# Patient Record
Sex: Female | Born: 1955 | Race: Black or African American | Hispanic: No | Smoking: Never smoker
Health system: Southern US, Community
[De-identification: ages and names within clinical notes are randomized; demographics above are authoritative.]

---

## 2013-11-25 ENCOUNTER — Encounter (HOSPITAL_COMMUNITY): Payer: Self-pay | Admitting: Emergency Medicine

## 2013-11-25 ENCOUNTER — Emergency Department (INDEPENDENT_AMBULATORY_CARE_PROVIDER_SITE_OTHER)
Admission: EM | Admit: 2013-11-25 | Discharge: 2013-11-25 | Disposition: A | Discharge status: Home / Self Care | Payer: Self-pay | Source: Home / Self Care | Attending: Family Medicine | Admitting: Family Medicine

## 2013-11-25 ENCOUNTER — Ambulatory Visit (HOSPITAL_COMMUNITY): Payer: Self-pay | Attending: Emergency Medicine

## 2013-11-25 ENCOUNTER — Ambulatory Visit (HOSPITAL_COMMUNITY): Payer: Self-pay

## 2013-11-25 DIAGNOSIS — K5900 Constipation, unspecified: Secondary | ICD-10-CM

## 2013-11-25 DIAGNOSIS — K2970 Gastritis, unspecified, without bleeding: Secondary | ICD-10-CM

## 2013-11-25 DIAGNOSIS — R1011 Right upper quadrant pain: Secondary | ICD-10-CM | POA: Insufficient documentation

## 2013-11-25 DIAGNOSIS — R1010 Upper abdominal pain, unspecified: Secondary | ICD-10-CM

## 2013-11-25 DIAGNOSIS — B9681 Helicobacter pylori [H. pylori] as the cause of diseases classified elsewhere: Secondary | ICD-10-CM

## 2013-11-25 DIAGNOSIS — R1013 Epigastric pain: Secondary | ICD-10-CM

## 2013-11-25 LAB — CBC WITH DIFFERENTIAL/PLATELET
Basophils Absolute: 0 10*3/uL (ref 0.0–0.1)
Basophils Relative: 0 % (ref 0–1)
EOS ABS: 0.1 10*3/uL (ref 0.0–0.7)
EOS PCT: 2 % (ref 0–5)
HCT: 42.6 % (ref 36.0–46.0)
HEMOGLOBIN: 14.7 g/dL (ref 12.0–15.0)
LYMPHS ABS: 2 10*3/uL (ref 0.7–4.0)
Lymphocytes Relative: 39 % (ref 12–46)
MCH: 27.8 pg (ref 26.0–34.0)
MCHC: 34.5 g/dL (ref 30.0–36.0)
MCV: 80.5 fL (ref 78.0–100.0)
Monocytes Absolute: 0.4 10*3/uL (ref 0.1–1.0)
Monocytes Relative: 7 % (ref 3–12)
Neutro Abs: 2.5 10*3/uL (ref 1.7–7.7)
Neutrophils Relative %: 52 % (ref 43–77)
Platelets: 250 10*3/uL (ref 150–400)
RBC: 5.29 MIL/uL — AB (ref 3.87–5.11)
RDW: 13.5 % (ref 11.5–15.5)
WBC: 5 10*3/uL (ref 4.0–10.5)

## 2013-11-25 LAB — COMPREHENSIVE METABOLIC PANEL
ALK PHOS: 69 U/L (ref 39–117)
ALT: 24 U/L (ref 0–35)
ANION GAP: 11 (ref 5–15)
AST: 18 U/L (ref 0–37)
Albumin: 4.1 g/dL (ref 3.5–5.2)
BUN: 12 mg/dL (ref 6–23)
CO2: 29 meq/L (ref 19–32)
Calcium: 10.2 mg/dL (ref 8.4–10.5)
Chloride: 97 mEq/L (ref 96–112)
Creatinine, Ser: 0.85 mg/dL (ref 0.50–1.10)
GFR calc Af Amer: 87 mL/min — ABNORMAL LOW (ref >90)
GFR, EST NON AFRICAN AMERICAN: 75 mL/min — AB (ref >90)
Glucose, Bld: 97 mg/dL (ref 70–99)
Potassium: 4 mEq/L (ref 3.7–5.3)
Sodium: 137 mEq/L (ref 137–147)
TOTAL PROTEIN: 8.4 g/dL — AB (ref 6.0–8.3)
Total Bilirubin: 0.6 mg/dL (ref 0.3–1.2)

## 2013-11-25 LAB — POCT URINALYSIS DIP (DEVICE)
GLUCOSE, UA: NEGATIVE mg/dL
HGB URINE DIPSTICK: NEGATIVE
KETONES UR: NEGATIVE mg/dL
Leukocytes, UA: NEGATIVE
Nitrite: NEGATIVE
Protein, ur: NEGATIVE mg/dL
SPECIFIC GRAVITY, URINE: 1.025 (ref 1.005–1.030)
Urobilinogen, UA: 0.2 mg/dL (ref 0.0–1.0)
pH: 5.5 (ref 5.0–8.0)

## 2013-11-25 LAB — POCT PREGNANCY, URINE: Preg Test, Ur: NEGATIVE

## 2013-11-25 LAB — POCT H PYLORI SCREEN: H. PYLORI SCREEN, POC: POSITIVE — AB

## 2013-11-25 LAB — LIPASE, BLOOD: Lipase: 18 U/L (ref 11–59)

## 2013-11-25 MED ORDER — OMEPRAZOLE 20 MG PO CPDR: DELAYED_RELEASE_CAPSULE | ORAL | Status: DC

## 2013-11-25 MED ORDER — METRONIDAZOLE 500 MG PO TABS
500.0000 mg | ORAL_TABLET | Freq: Two times a day (BID) | ORAL | Status: DC
Start: 2013-11-25 — End: 2015-03-31

## 2013-11-25 MED ORDER — AMOXICILLIN 500 MG PO CAPS: 1000.0000 mg | ORAL_CAPSULE | Freq: Two times a day (BID) | ORAL | Status: DC

## 2013-11-25 MED ORDER — CLARITHROMYCIN 500 MG PO TABS: 500.0000 mg | ORAL_TABLET | Freq: Two times a day (BID) | ORAL | Status: DC

## 2013-11-25 NOTE — ED Notes (Signed)
Patient transported to X-ray 

## 2013-11-25 NOTE — ED Notes (Signed)
Called [place]; there was no answer

## 2013-11-25 NOTE — ED Notes (Addendum)
Via Daughter, [place] interpreter Pt c/o lower back pain onset 5 days; pain radiates to LUQ; getting worse Sx also include: nauseas, constipation Denies fevers, diarrhea Alert, no signs of acute distress.

## 2013-11-25 NOTE — Discharge Instructions (Signed)
Abdominal Pain °Many things can cause abdominal pain. Usually, abdominal pain is not caused by a disease and will improve without treatment. It can often be observed and treated at home. Your health care provider will do a physical exam and possibly order blood tests and X-rays to help determine the seriousness of your pain. However, in many cases, more time must pass before a clear cause of the pain can be found. Before that point, your health care provider may not know if you need more testing or further treatment. °HOME CARE INSTRUCTIONS  °Monitor your abdominal pain for any changes. The following actions may help to alleviate any discomfort you are experiencing: °· Only take over-the-counter or prescription medicines as directed by your health care provider. °· Do not take laxatives unless directed to do so by your health care provider. °· Try a clear liquid diet (broth, tea, or water) as directed by your health care provider. Slowly move to a bland diet as tolerated. °SEEK MEDICAL CARE IF: °· You have unexplained abdominal pain. °· You have abdominal pain associated with nausea or diarrhea. °· You have pain when you urinate or have a bowel movement. °· You experience abdominal pain that wakes you in the night. °· You have abdominal pain that is worsened or improved by eating food. °· You have abdominal pain that is worsened with eating fatty foods. °· You have a fever. °SEEK IMMEDIATE MEDICAL CARE IF:  °· Your pain does not go away within 2 hours. °· You keep throwing up (vomiting). °· Your pain is felt only in portions of the abdomen, such as the right side or the left lower portion of the abdomen. °· You pass bloody or black tarry stools. °MAKE SURE YOU: °· Understand these instructions.   °· Will watch your condition.   °· Will get help right away if you are not doing well or get worse.   °Document Released: 10/29/2004 Document Revised: 01/24/2013 Document Reviewed: 09/28/2012 °ExitCare® Patient Information  ©2015 [employer]. This information is not intended to replace advice given to you by your health care provider. Make sure you discuss any questions you have with your health care provider. ° °Gastritis, Adult °Gastritis is soreness and swelling (inflammation) of the lining of the stomach. Gastritis can develop as a sudden onset (acute) or long-term (chronic) condition. If gastritis is not treated, it can lead to stomach bleeding and ulcers. °CAUSES  °Gastritis occurs when the stomach lining is weak or damaged. Digestive juices from the stomach then inflame the weakened stomach lining. The stomach lining may be weak or damaged due to viral or bacterial infections. One common bacterial infection is the Helicobacter pylori infection. Gastritis can also result from excessive alcohol consumption, taking certain medicines, or having too much acid in the stomach.  °SYMPTOMS  °In some cases, there are no symptoms. When symptoms are present, they may include: °· Pain or a burning sensation in the upper abdomen. °· Nausea. °· Vomiting. °· An uncomfortable feeling of fullness after eating. °DIAGNOSIS  °Your caregiver may suspect you have gastritis based on your symptoms and a physical exam. To determine the cause of your gastritis, your caregiver may perform the following: °· Blood or stool tests to check for the H pylori bacterium. °· Gastroscopy. A thin, flexible tube (endoscope) is passed down the esophagus and into the stomach. The endoscope has a light and camera on the end. Your caregiver uses the endoscope to view the inside of the stomach. °· Taking a tissue sample (biopsy)   from the stomach to examine under a microscope. °TREATMENT  °Depending on the cause of your gastritis, medicines may be prescribed. If you have a bacterial infection, such as an H pylori infection, antibiotics may be given. If your gastritis is caused by too much acid in the stomach, H2 blockers or antacids may be given. Your caregiver may  recommend that you stop taking aspirin, ibuprofen, or other nonsteroidal anti-inflammatory drugs (NSAIDs). °HOME CARE INSTRUCTIONS °· Only take over-the-counter or prescription medicines as directed by your caregiver. °· If you were given antibiotic medicines, take them as directed. Finish them even if you start to feel better. °· Drink enough fluids to keep your urine clear or pale yellow. °· Avoid foods and drinks that make your symptoms worse, such as: °¨ Caffeine or alcoholic drinks. °¨ Chocolate. °¨ Peppermint or mint flavorings. °¨ Garlic and onions. °¨ Spicy foods. °¨ Citrus fruits, such as oranges, lemons, or limes. °¨ Tomato-based foods such as sauce, chili, salsa, and pizza. °¨ Fried and fatty foods. °· Eat small, frequent meals instead of large meals. °SEEK IMMEDIATE MEDICAL CARE IF:  °· You have black or dark red stools. °· You vomit blood or material that looks like coffee grounds. °· You are unable to keep fluids down. °· Your abdominal pain gets worse. °· You have a fever. °· You do not feel better after 1 week. °· You have any other questions or concerns. °MAKE SURE YOU: °· Understand these instructions. °· Will watch your condition. °· Will get help right away if you are not doing well or get worse. °Document Released: 01/13/2001 Document Revised: 07/21/2011 Document Reviewed: 03/04/2011 °ExitCare® Patient Information ©2015 [employer]. This information is not intended to replace advice given to you by your health care provider. Make sure you discuss any questions you have with your health care provider. ° °

## 2013-11-25 NOTE — ED Notes (Signed)
I spoke with Kara Rowe in [place] she said patient could come to the department and wait.  She would get to her as soon as she could but had 2 patients ahead of her

## 2013-11-25 NOTE — ED Provider Notes (Signed)
CSN: [id number]     Arrival date & time 11/25/13  [id number] History   First MD Initiated Contact with Patient 11/25/13 [phone]     Chief Complaint  Patient presents with  . Back Pain   (Consider location/radiation/quality/duration/timing/severity/associated sxs/prior Treatment) HPI       58 year old female presents for evaluation of 5 days of back and abdominal pain, constipation nausea, and one episode of vomiting yesterday. The symptoms are intermittent and gradually progressive. The pain is located in her left lower quadrant of her abdomen and in the right lower back. She denies any injury. She last had a normal bowel movement on Wednesday, 3 days ago, this is abnormal for her. She has nausea but only vomited once last night, she has not attempted to eat anything today. She admits to a possible recent history of weight loss although she did not have much because she has not weighed herself in 4 years, she just feels like maybe she has lost weight. Denies any blood in her stool.  History reviewed. No pertinent past medical history. History reviewed. No pertinent past surgical history. No family history on file. History  Substance Use Topics  . Smoking status: Never Smoker   . Smokeless tobacco: Not on file  . Alcohol Use: No   OB History   Grav Para Term Preterm Abortions TAB SAB Ect Mult Living                 Review of Systems  Gastrointestinal: Positive for nausea, vomiting, abdominal pain and constipation. Negative for diarrhea.  Musculoskeletal: Positive for back pain.  All other systems reviewed and are negative.   Allergies  Review of patient's allergies indicates no known allergies.  Home Medications   Prior to Admission medications   Medication Sig Start Date End Date Taking? Authorizing Provider  amoxicillin (AMOXIL) 500 MG capsule Take 2 capsules (1,000 mg total) by mouth 2 (two) times daily. 11/25/13   Adrian BlackwaterZachary H Katriana Dortch, PA-C  clarithromycin (BIAXIN) 500 MG tablet Take 1  tablet (500 mg total) by mouth 2 (two) times daily. 11/25/13   Adrian BlackwaterZachary H Jovanna Hodges, PA-C  metroNIDAZOLE (FLAGYL) 500 MG tablet Take 1 tablet (500 mg total) by mouth 2 (two) times daily. 11/25/13   Graylon GoodZachary H Denym Christenberry, PA-C  omeprazole (PRILOSEC) 20 MG capsule 2 capsules daily for 10 days, then 1 capsule daily for 3 months 11/25/13   Graylon GoodZachary H Nafeesah Lapaglia, PA-C   BP 132/83  Pulse 63  Temp(Src) 97.7 F (36.5 C) (Oral)  Resp 12  SpO2 100% Physical Exam  Nursing note and vitals reviewed. Constitutional: She is oriented to person, place, and time. Vital signs are normal. She appears well-developed and well-nourished. No distress.  HENT:  Head: Normocephalic and atraumatic.  Cardiovascular: Normal rate, regular rhythm and normal heart sounds.   Pulmonary/Chest: Effort normal and breath sounds normal. No respiratory distress.  Abdominal: Normal appearance and bowel sounds are normal. She exhibits no ascites and no mass. There is no hepatosplenomegaly (difficult to assess due to habitus). There is tenderness in the right upper quadrant, epigastric area and left upper quadrant. There is no rigidity, no rebound, no guarding, no CVA tenderness, no tenderness at McBurney's point and negative Murphy's sign.  Musculoskeletal:       Lumbar back: She exhibits tenderness (right lower paraspinous musculature) and pain. She exhibits normal range of motion, no bony tenderness, no edema, no deformity and no spasm.  Neurological: She is alert and oriented to person, place, and time. She  has normal strength and normal reflexes. No sensory deficit. She exhibits normal muscle tone. She displays a negative Romberg sign. Coordination and gait normal.  Skin: Skin is warm and dry. No rash noted. She is not diaphoretic.  Psychiatric: She has a normal mood and affect. Judgment normal.    ED Course  Procedures (including critical care time) Labs Review Labs Reviewed  CBC WITH DIFFERENTIAL - Abnormal; Notable for the following:     RBC 5.29 (*)    All other components within normal limits  COMPREHENSIVE METABOLIC PANEL - Abnormal; Notable for the following:    Total Protein 8.4 (*)    GFR calc non Af Amer 75 (*)    GFR calc Af Amer 87 (*)    All other components within normal limits  POCT URINALYSIS DIP (DEVICE) - Abnormal; Notable for the following:    Bilirubin Urine SMALL (*)    All other components within normal limits  POCT H PYLORI SCREEN - Abnormal; Notable for the following:    H. PYLORI SCREEN, POC POSITIVE (*)    All other components within normal limits  LIPASE, BLOOD  POCT PREGNANCY, URINE    Imaging Review US Abdomen Complete  11/25/2013   CLINICAL DATA:  Epigastric abdominal pain  EXAM: ULTRASOUND ABDOMEN COMPLETE  COMPARISON:  None  FINDINGS: Gallbladder: Normally distended without stones or wall thickening.  No pericholecystic fluid or sonographic Murphy sign.  Common bile duct: Diameter: Normal caliber 4 mm diameter  Liver: Normal parenchymal echogenicity. Scattered parenchymal calcifications question old granulomata. No focal mass. Hepatopetal portal venous flow.  IVC: Normal appearance  Pancreas: Normal appearance  Spleen: Normal appearance, 8.0 cm length  Right Kidney: Length: 12.0 cm. Cortical thinning. Upper normal cortical echogenicity. No mass or hydronephrosis.  Left Kidney: Length: 12.0 cm length. Cortical thinning. Upper normal cortical echogenicity. No mass or hydronephrosis.  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  Mild renal cortical atrophy.  No acute abnormalities.   Electronically Signed   By: Ulyses Southward M.D.   On: 11/25/2013 13:50   Dg Abd Acute W/chest  11/25/2013   CLINICAL DATA:  58 year old female with right upper quadrant abdominal pain. Constipation.  EXAM: ACUTE ABDOMEN SERIES (ABDOMEN 2 VIEW & CHEST 1 VIEW)  COMPARISON:  No priors.  FINDINGS: Multiple calcified left hilar lymph nodes. Lung  volumes are normal. No consolidative airspace disease. No pleural effusions. No pneumothorax. No pulmonary nodule or mass noted. Pulmonary vasculature and the cardiomediastinal silhouette are within normal limits.  Multiple small calcifications project over the right upper quadrant, favored to either represent gallstones or calcifications with colonic diverticulae. Several other scattered calcifications are seen throughout the abdomen is while, favored to represent calcifications in colonic diverticulae. Gas and stool are seen scattered throughout the colon extending to the level of the distal rectum. No pathologic distension of small bowel is noted. No gross evidence of pneumoperitoneum.  IMPRESSION: 1. Nonobstructive bowel gas pattern. 2. No pneumoperitoneum. 3. No radiographic evidence of acute cardiopulmonary disease. 4. Multiple calcifications throughout the abdomen, favored to reflect calcifications within colonic diverticulae. Strictly speaking, the calcifications in the right upper quadrant could represent calcified gallstones. Clinical correlation is recommended, with consideration for further evaluation with right upper quadrant abdominal ultrasound if clinically appropriate. 5. Calcified left hilar lymph nodes.   Electronically Signed   By: Trudie Reed M.D.   On: 11/25/2013 11:57     MDM   1.  Acute abdominal pain in right upper quadrant   2. Constipation   3. Abdominal pain, epigastric   4. Helicobacter pylori gastritis    Labs renal positive H. pylori screen, rest of labs negative. X-ray shows possible calcified gallstones, ultrasound obtained to rule out cholelithiasis and was negative. Discharge with sequential therapy for H. pylori gastritis. Followup with gastroenterology if no improvement.   Meds ordered this encounter  Medications  . amoxicillin (AMOXIL) 500 MG capsule    Sig: Take 2 capsules (1,000 mg total) by mouth 2 (two) times daily.    Dispense:  20 capsule    Refill:   0    Days 1 through 5    Order Specific Question:  Supervising Provider    Answer:  Clementeen GrahamOREY, EVAN, S [[id number]]  . metroNIDAZOLE (FLAGYL) 500 MG tablet    Sig: Take 1 tablet (500 mg total) by mouth 2 (two) times daily.    Dispense:  10 tablet    Refill:  0    Days 6 through 10    Order Specific Question:  Supervising Provider    Answer:  Clementeen GrahamOREY, EVAN, S [[id number]]  . clarithromycin (BIAXIN) 500 MG tablet    Sig: Take 1 tablet (500 mg total) by mouth 2 (two) times daily.    Dispense:  10 tablet    Refill:  0    Days 6 through 10    Order Specific Question:  Supervising Provider    Answer:  Clementeen GrahamOREY, EVAN, S [[id number]]  . omeprazole (PRILOSEC) 20 MG capsule    Sig: 2 capsules daily for 10 days, then 1 capsule daily for 3 months    Dispense:  30 capsule    Refill:  2    Order Specific Question:  Supervising Provider    Answer:  Clementeen GrahamOREY, EVAN, Kathie RhodesS [[id number]]       Graylon GoodZachary H Adreanne Yono, PA-C 11/25/13 [phone]

## 2013-11-26 NOTE — ED Provider Notes (Signed)
Medical screening examination/treatment/procedure(s) were performed by a resident physician or non-physician practitioner and as the supervising physician I was immediately available for consultation/collaboration.  Cambelle Suchecki, MD    Fumie Fiallo S Arianna Haydon, MD 11/26/13 1104 

## 2015-03-31 ENCOUNTER — Ambulatory Visit (INDEPENDENT_AMBULATORY_CARE_PROVIDER_SITE_OTHER): Payer: Worker's Compensation | Admitting: Emergency Medicine

## 2015-03-31 ENCOUNTER — Ambulatory Visit: Payer: Worker's Compensation

## 2015-03-31 VITALS — BP 122/60 | HR 64 | Temp 97.7°F | Resp 12 | Ht 69.75 in | Wt 223.0 lb

## 2015-03-31 DIAGNOSIS — S93401A Sprain of unspecified ligament of right ankle, initial encounter: Secondary | ICD-10-CM

## 2015-03-31 DIAGNOSIS — S8001XA Contusion of right knee, initial encounter: Secondary | ICD-10-CM

## 2015-03-31 DIAGNOSIS — S7001XA Contusion of right hip, initial encounter: Secondary | ICD-10-CM

## 2015-03-31 LAB — POCT URINE PREGNANCY: Preg Test, Ur: NEGATIVE

## 2015-03-31 MED ORDER — TRAMADOL HCL 50 MG PO TABS: 50.0000 mg | ORAL_TABLET | Freq: Three times a day (TID) | ORAL | Status: AC | PRN

## 2015-03-31 NOTE — Addendum Note (Signed)
Addended by: Lesle Chris A on: 03/31/2015 05:56 PM   Modules accepted: Kipp Brood

## 2015-03-31 NOTE — Patient Instructions (Addendum)
Please do limited walking. Try ice to apply to your hip, knee ,and ankle. I will make you an appointment for you to see the orthopedist. You will be on sit down work only.   Because you received an x-ray today, you will receive an invoice from [facility]. Please contact [facility] at [phone] with questions or concerns regarding your invoice. Our billing staff will not be able to assist you with those questions.

## 2015-03-31 NOTE — Progress Notes (Addendum)
    This chart was scribed for Kara Lites, MD by [facility], medical scribe at [facility].The patient was seen in exam room 12 and the patient's care was started at 4:31 PM.  Chief Complaint:  Chief Complaint  Patient presents with  . Knee Pain    right knee pain and right foot, slipped on floor at work   HPI: Kara Rowe is a 60 y.o. female who reports to [employer] today for an initial workers comp evaluation. While cleaning the bathroom at work she slipped and fell onto her right knee, injuring her right leg. The pain starts at her right hip and radiates down to her right foot. The primary areas that hurt her are her right hip, right knee and right foot. No back pain. She is having irregular periods, her youngest child is 37 years old. No medical problems. Pt speaks french and an interpretor was needed.  No orders of the defined types were placed in this encounter.   No Known Allergies   ROS: The patient denies fevers, chills, night sweats, unintentional weight loss, chest pain, palpitations, wheezing, dyspnea on exertion, nausea, vomiting, abdominal pain, dysuria, hematuria, melena, numbness, weakness, or tingling.  All other systems have been reviewed and were otherwise negative with the exception of those mentioned in the HPI and as above.    PHYSICAL EXAM: Filed Vitals:   03/31/15 1534  BP: 122/60  Pulse: 64  Temp: 97.7 F (36.5 C)  Resp: 12   Body mass index is 32.21 kg/(m^2).  General: Alert, no acute distress HEENT:  Normocephalic, atraumatic, oropharynx patent. Eye: Nonie Hoyer [facility] Cardiovascular:  Regular rate and rhythm, no rubs murmurs or gallops.  No Carotid bruits, radial pulse intact. No pedal edema.  Respiratory: Clear to auscultation bilaterally.  No wheezes, rales, or rhonchi.  No cyanosis, no use of accessory musculature Abdominal: No organomegaly, abdomen is soft and non-tender, positive bowel sounds.  No masses. Musculoskeletal: Gait  intact. No edema. Tender over the proximal right hip. Able to ambulate, crepitation over the right knee but no instability. Tenderness over the right ankle with mild lateral swelling. Skin: No rashes. Neurologic: Facial musculature symmetric. Psychiatric: Patient acts appropriately throughout our interaction. Lymphatic: No cervical or submandibular lymphadenopathy Genitourinary/Anorectal: No acute findings   LABS: Results for orders placed or performed in visit on 03/31/15  POCT urine pregnancy  Result Value Ref Range   Preg Test, Ur Negative Negative    EKG/XRAY:   Primary read interpreted by Dr. Cleta Alberts at [facility].  ASSESSMENT/PLAN: Will use Ultram for pain. Patient cleared for sit down work only. Referral made to ortho. Gross sideeffects, risk and benefits, and alternatives of medications d/w patient. Patient is aware that all medications have potential sideeffects and we are unable to predict every sideeffect or drug-drug interaction that may occur.  By signing my name below, I, Kara Rowe, attest that this documentation has been prepared under the direction and in the presence of Kara Lites, MD.  Electronically Signed: Conchita Rowe, medical scribe. 03/31/2015, 4:46 PM.  Kara Chris MD 03/31/2015 4:22 PM

## 2017-05-15 IMAGING — CR DG HIP (WITH OR WITHOUT PELVIS) 2-3V*R*
2 series · 2 of 2 positions shown · non-contrast
Comparison: None.

CLINICAL DATA: Workers Compensation evaluation after an injury at
work. Patient slipped while cleaning and bathroom, falling onto the
right knee, injuring the right leg. Pain begins at the right hip and
extends down to the right foot. Initial encounter.

EXAM:
DG HIP (WITH OR WITHOUT PELVIS) 2-3V RIGHT

[AP]
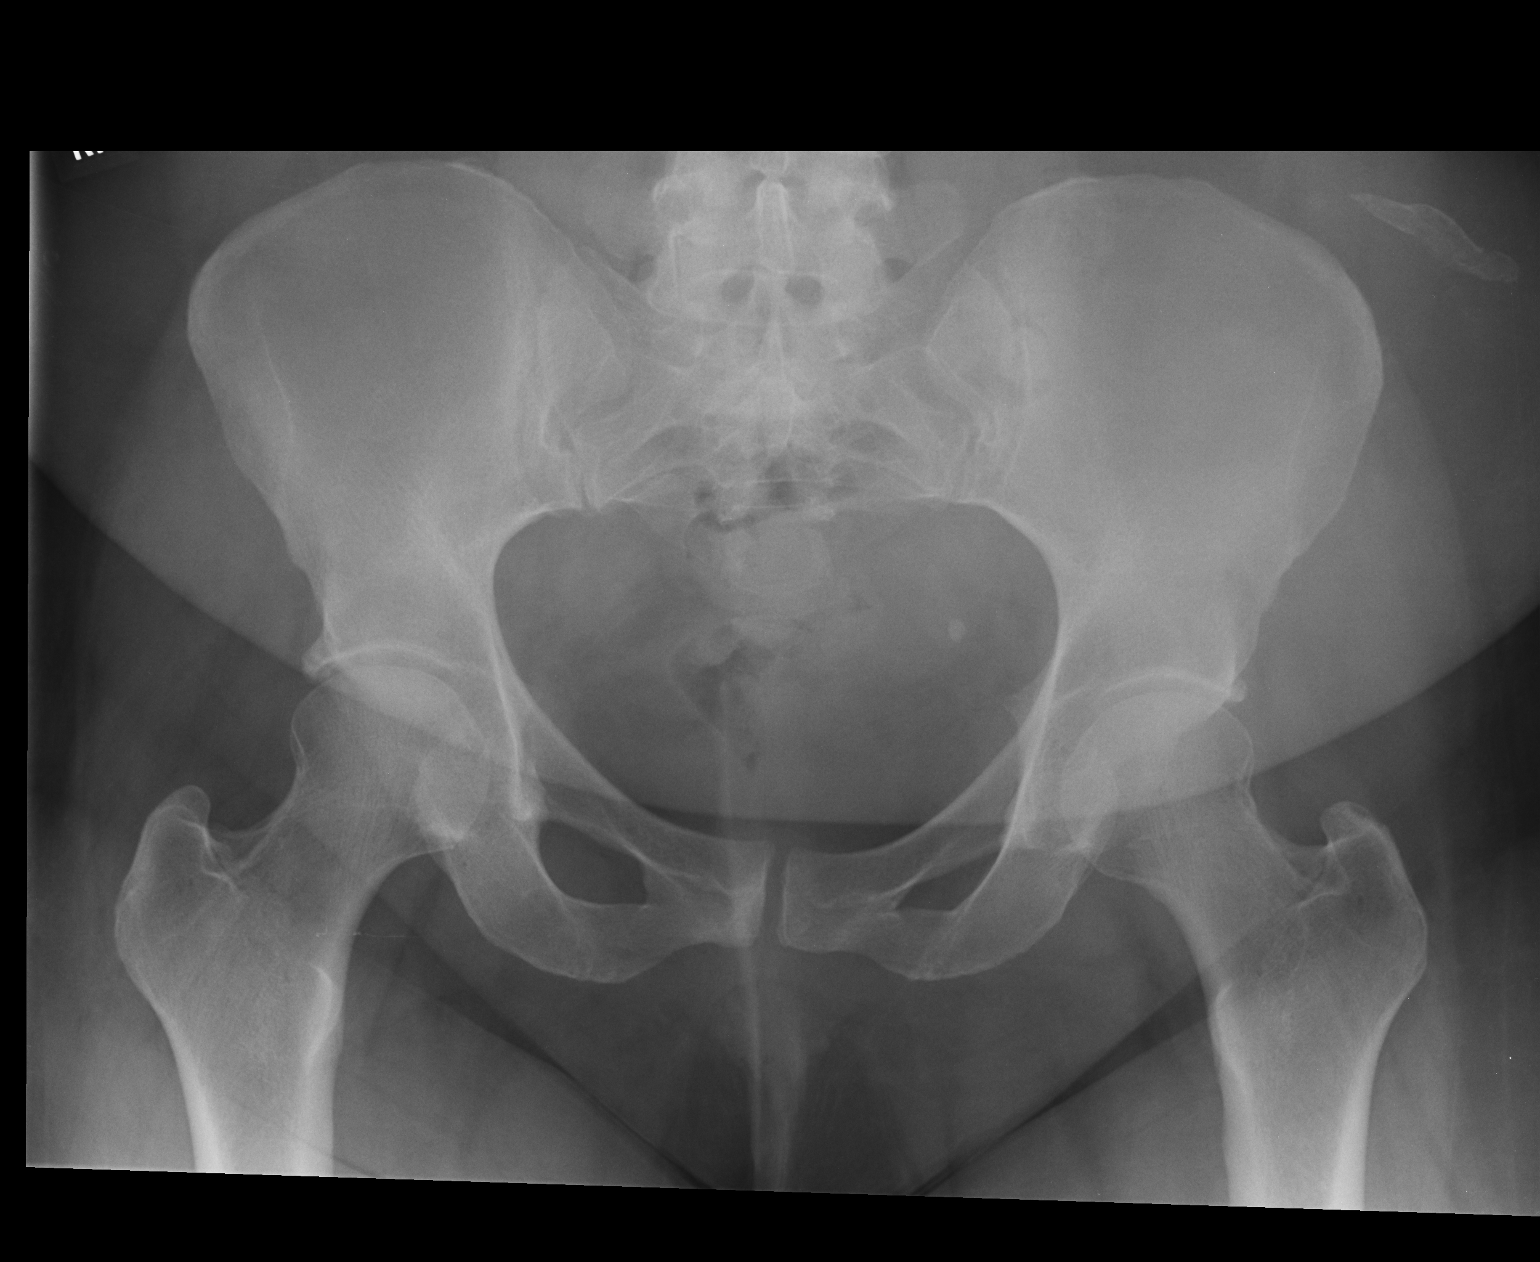

[lateral]
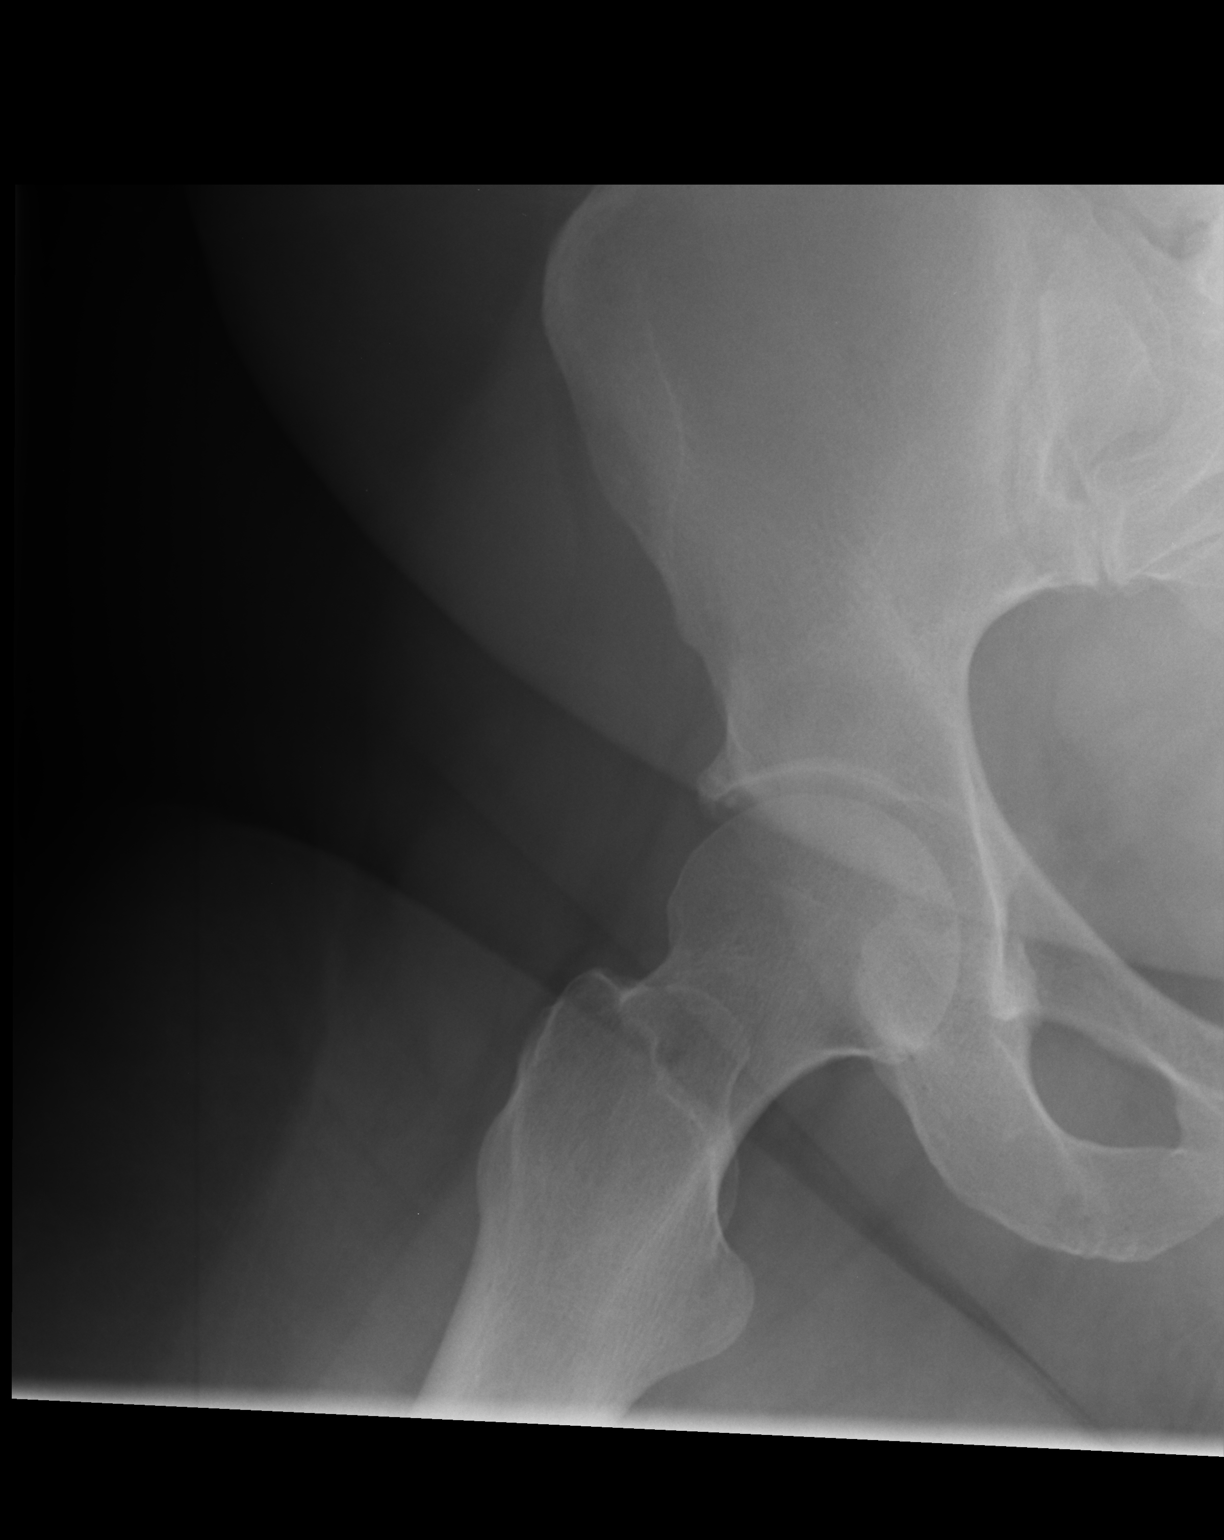

[2 of 2 positions shown; findings below may reference images not displayed]

FINDINGS: No evidence of acute fracture or dislocation. Well preserved joint
space. Well preserved bone mineral density. No intrinsic osseous
abnormality.

Included AP pelvis demonstrates a normal-appearing contralateral
left hip. Sacroiliac joints and symphysis pubis intact. Visualized
lower lumbar spine unremarkable.
IMPRESSION: Normal examination.
